# Patient Record
Sex: Female | Born: 2005 | Race: White | Hispanic: No | Marital: Single | Smoking: Never smoker
Health system: Southern US, Community
[De-identification: ages and names within clinical notes are randomized; demographics above are authoritative.]

## PROBLEM LIST (undated history)

## (undated) DIAGNOSIS — Z789 Other specified health status: Secondary | ICD-10-CM

## (undated) HISTORY — PX: WISDOM TOOTH EXTRACTION: SHX21

## (undated) HISTORY — PX: NO PAST SURGERIES: SHX2092

## (undated) HISTORY — DX: Other specified health status: Z78.9

---
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
## 2023-05-04 ENCOUNTER — Ambulatory Visit: Payer: Medicaid Other

## 2023-05-04 VITALS — BP 129/59 | Ht 63.0 in | Wt 121.5 lb

## 2023-05-04 DIAGNOSIS — Z309 Encounter for contraceptive management, unspecified: Secondary | ICD-10-CM | POA: Diagnosis not present

## 2023-05-04 DIAGNOSIS — Z3201 Encounter for pregnancy test, result positive: Secondary | ICD-10-CM | POA: Diagnosis not present

## 2023-05-04 LAB — PREGNANCY, URINE: Preg Test, Ur: POSITIVE — AB

## 2023-05-04 MED ORDER — PRENATAL 27-0.8 MG PO TABS: 1.0000 | ORAL_TABLET | Freq: Every day | ORAL | Status: AC

## 2023-05-04 NOTE — Progress Notes (Signed)
UPT positive. Unsure where she plans prenatal care. Local prenatal provider list given and reviewed. Encouraged to establish prenatal care ASAP. Positive preg packet given and reviewed.   The patient was dispensed prenatal vitamins #100 today per [facility] Dr Lorrin Mais. I provided counseling today regarding the medication. We discussed the medication, the side effects and when to call clinic. Patient given the opportunity to ask questions. Questions answered.    Sent to clerk for presumptive Medicaid/preg women. Jerel Shepherd, RN

## 2023-05-05 ENCOUNTER — Telehealth: Payer: Self-pay | Admitting: Family Medicine

## 2023-05-05 NOTE — Telephone Encounter (Signed)
Please give me a call I would like to schedule an ultra sound for myself

## 2023-05-05 NOTE — Telephone Encounter (Signed)
Dr. Larita Fife sent message to Sally Harrison to call client and schedule new OB appt. Call to client and sister answered phone stating client was not there. Counseled sister returning Sally Harrison's call and explained as not yet established in [facility] as a client, [place] could not be ordered. Counseled sister that if having a problem and concerned needed an [place], to call her PCP, Urgent Care or ED evaluation. Per sister, Sally Harrison is newly pregnanat and just wanted to see her baby. Dr. Larita Fife notified of above. Jossie Ng, RN

## 2023-05-16 DIAGNOSIS — O0993 Supervision of high risk pregnancy, unspecified, third trimester: Secondary | ICD-10-CM | POA: Insufficient documentation

## 2023-06-06 LAB — OB RESULTS CONSOLE GC/CHLAMYDIA
Chlamydia: NEGATIVE
Neisseria Gonorrhea: NEGATIVE

## 2023-06-06 LAB — OB RESULTS CONSOLE VARICELLA ZOSTER ANTIBODY, IGG: Varicella: NON-IMMUNE/NOT IMMUNE

## 2023-06-06 LAB — OB RESULTS CONSOLE RUBELLA ANTIBODY, IGM: Rubella: IMMUNE

## 2023-06-06 LAB — OB RESULTS CONSOLE HIV ANTIBODY (ROUTINE TESTING): HIV: NONREACTIVE

## 2023-06-06 LAB — HEPATITIS C ANTIBODY: HCV Ab: NEGATIVE

## 2023-06-06 LAB — OB RESULTS CONSOLE HEPATITIS B SURFACE ANTIGEN: Hepatitis B Surface Ag: NEGATIVE

## 2023-06-06 LAB — OB RESULTS CONSOLE RPR: RPR: NONREACTIVE

## 2023-06-14 ENCOUNTER — Other Ambulatory Visit: Payer: Self-pay

## 2023-06-14 ENCOUNTER — Ambulatory Visit: Payer: Medicaid Other

## 2023-06-14 DIAGNOSIS — O09899 Supervision of other high risk pregnancies, unspecified trimester: Secondary | ICD-10-CM | POA: Insufficient documentation

## 2023-06-14 DIAGNOSIS — O280 Abnormal hematological finding on antenatal screening of mother: Secondary | ICD-10-CM | POA: Insufficient documentation

## 2023-06-19 ENCOUNTER — Other Ambulatory Visit: Payer: Self-pay | Admitting: Obstetrics

## 2023-06-19 ENCOUNTER — Ambulatory Visit: Payer: Medicaid Other

## 2023-06-19 ENCOUNTER — Other Ambulatory Visit: Payer: Self-pay

## 2023-06-19 ENCOUNTER — Ambulatory Visit: Payer: Medicaid Other | Admitting: *Deleted

## 2023-06-19 ENCOUNTER — Other Ambulatory Visit: Payer: Self-pay | Admitting: *Deleted

## 2023-06-19 ENCOUNTER — Ambulatory Visit: Payer: Medicaid Other | Attending: Obstetrics and Gynecology

## 2023-06-19 VITALS — BP 106/55 | HR 65

## 2023-06-19 DIAGNOSIS — O3512X0 Maternal care for (suspected) chromosomal abnormality in fetus, trisomy 18, not applicable or unspecified: Secondary | ICD-10-CM | POA: Insufficient documentation

## 2023-06-19 DIAGNOSIS — O09899 Supervision of other high risk pregnancies, unspecified trimester: Secondary | ICD-10-CM | POA: Insufficient documentation

## 2023-06-19 DIAGNOSIS — O280 Abnormal hematological finding on antenatal screening of mother: Secondary | ICD-10-CM

## 2023-06-19 DIAGNOSIS — Z363 Encounter for antenatal screening for malformations: Secondary | ICD-10-CM

## 2023-06-19 DIAGNOSIS — O285 Abnormal chromosomal and genetic finding on antenatal screening of mother: Secondary | ICD-10-CM | POA: Diagnosis present

## 2023-06-19 DIAGNOSIS — O289 Unspecified abnormal findings on antenatal screening of mother: Secondary | ICD-10-CM | POA: Insufficient documentation

## 2023-06-19 DIAGNOSIS — Z3A12 12 weeks gestation of pregnancy: Secondary | ICD-10-CM | POA: Diagnosis not present

## 2023-06-19 NOTE — Progress Notes (Signed)
[facility] at [facility] [address], [address] Phone:  [phone]   Fax:  [phone]      In-Person Genetic Counseling Clinic Note:   I spoke with 17 y.o. Sally Harrison today to discuss her recent MaterniT21 NIPS results which returned as high-risk for trisomy 25. She was referred by Chari Manning, CNM. She was accompanied by her reproductive partner [place].   Pregnancy History:    G1P0. EGA: [redacted]w[redacted]d by LMP. EDD: 12/27/2023. This is Naevia's first pregnancy.  Reports she takes PNVs. Denies personal history of diabetes, high blood pressure, thyroid conditions, and seizures. Denies bleeding, infections, and fevers in this pregnancy. Denies using tobacco, alcohol, or street drugs in this pregnancy.   Family History:    A three-generation pedigree was created and scanned into Epic under the Media tab.  Shealee reports that her maternal cousin has autism. It is unknown whether he had any genetic testing. We discussed that we are unable to directly test for autism in a pregnancy. Genetic testing for individuals with a clinical diagnosis of autism yields an explanation in only about 20% of cases, and the remaining 80% of cases are left with unknown etiology. Having an affected family member may increase the chance that Soraya's children will have autism; however, without genetic testing performed on affected family members, it is difficult to assess risk to the pregnancy and other family members. The risk may be up to 50% in the case of an identified genetic cause. We also discussed and offered screening for fragile X syndrome, which is one of the most common causes of inherited intellectual disability and autism in males. Fragile X syndrome affects females as well. Denali declined fragile X syndrome carrier screening.  Orene's reproductive partner Irving Shows reports that his mother died from breast cancer at 31 yo. We discussed that  different types of cancer can have a hereditary component that increases an individual's susceptibility to develop cancer; however, most cancers occur by chance due to a combination of genetics and the environment. It is recommended Miguel inform his PCP about his family history so they can discuss appropriate screening and testing options, including a possible referral to cancer genetic counseling.   Irving Shows also reports that his nephew was born with a "hole in his heart." He is otherwise healthy. Congenital heart defects can be genetic or may be multifactorial. Without known medical reports, recurrence risk is difficult to assess.  Maternal ethnicity reported as White and paternal ethnicity reported as [place]. Denies Ashkenazi Jewish ancestry.  Family history not remarkable for consanguinity, individuals with birth defects, intellectual disability, autism spectrum disorder, multiple spontaneous abortions, still births, or unexplained neonatal death.   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 The lab reports a 11 in 100 (11%) chance that this is a true positive result, also known as positive predictive value (PPV). The [employer] and the [employer] reports a 5% PPV.  Moreover, the result was low-risk for trisomy 29, trisomy 3, and sex chromosome aneuploidies. This reduces but does not eliminate the chance the fetus is affected with those conditions. Please see report for details.  It was reviewed with Kali that there are several possible explanations for the high risk cfDNA result, including true trisomy 34, mosaic trisomy 61, or confined placental mosaicism in which there is  discordance between the chromosomes of the placenta and the fetus.  We discussed that trisomy 59 is a chromosomal disorder that affects  multiple organ systems and is associated with congenital heart defects, growth delays, and high risk of fetal loss and stillbirth. Most pregnancies affected with trisomy 59 are identified by characteristic ultrasound findings, such as omphalocele and clenched fists. While many pregnancies with trisomy 18 end in pregnancy loss, those affected that survive to birth are expected to have profound intellectual disability and severe medical problems. Approximately 90% of individuals with trisomy 74 do not survive past the first year of life. We discussed that trisomy 56 typically occurs as a sporadic event and is not inherited through the family. Chromosome analysis is recommended during pregnancy or following delivery to determine if the fetus has typical trisomy 71, resulting from nondisjunction, or if the trisomy 61 is the result of a chromosomal translocation which may be inherited.   We discussed that if Katherene opted for diagnostic testing and results confirmed a diagnosis of trisomy 56, it could impact pregnancy management in several different ways. Some individuals may choose to terminate a pregnancy or consider adoption if a diagnosis of trisomy 56 were confirmed. For individuals who would not alter their pregnancy management regardless of testing outcomes, a prenatal diagnosis could allow for delivery planning and prenatal consults with various specialists that would be involved in the infant's care as well as time to plan and prepare emotionally, physically, and financially. Myrian was also made aware that she has the option of continuing to monitor the pregnancy with routine ultrasounds and pursue genetic testing postnatally.   We reviewed the technical aspects, benefits, risks, and limitations of prenatal diagnosis through chorionic villus sampling (CVS) including the 1 in 500 risk for miscarriage. Contina declined CVS at this time.   Previous Testing Completed:  Negative carrier screening: Tyriana  previously completed carrier screening through [employer]. She screened to not be a carrier for cystic fibrosis (CF) and spinal muscular atrophy (SMA). Her hemoglobin fractionation cascade returned as normal. Please note that this testing can detect beta-hemoglobinopathies but is not able to detect alpha thalassemia carriers. Please see report for details. A negative result on carrier screening reduces but does not eliminate the chance of being a carrier.    Plan of Care:   Trina declined CVS.  Anatomy ultrasound with our clinic is scheduled for 08/07/2023.   Informed consent was obtained. All questions were answered.   I spent 30 minutes in the care of the patient today, including face-to-face time reviewing and  discussing the genetic test results and available next steps.    Thank you for sharing in the care of Kingsley with [place].  Please do not hesitate to contact us at [phone] if you have any questions.   Sheppard Plumber, MS Genetic Counselor   Genetic counseling student involved in appointment: Yes Delorise Jackson Ladean Raya).

## 2023-06-27 ENCOUNTER — Ambulatory Visit: Payer: Self-pay

## 2023-07-20 ENCOUNTER — Emergency Department
Admission: EM | Admit: 2023-07-20 | Discharge: 2023-07-20 | Disposition: A | Discharge status: Home / Self Care | Payer: Medicaid Other | Attending: Emergency Medicine | Admitting: Emergency Medicine

## 2023-07-20 ENCOUNTER — Emergency Department: Payer: Medicaid Other

## 2023-07-20 ENCOUNTER — Other Ambulatory Visit: Payer: Self-pay

## 2023-07-20 DIAGNOSIS — O26892 Other specified pregnancy related conditions, second trimester: Secondary | ICD-10-CM | POA: Diagnosis present

## 2023-07-20 DIAGNOSIS — Z3A17 17 weeks gestation of pregnancy: Secondary | ICD-10-CM | POA: Insufficient documentation

## 2023-07-20 DIAGNOSIS — D72829 Elevated white blood cell count, unspecified: Secondary | ICD-10-CM | POA: Diagnosis not present

## 2023-07-20 DIAGNOSIS — E871 Hypo-osmolality and hyponatremia: Secondary | ICD-10-CM | POA: Insufficient documentation

## 2023-07-20 LAB — URINALYSIS, ROUTINE W REFLEX MICROSCOPIC
Bilirubin Urine: NEGATIVE
Glucose, UA: NEGATIVE mg/dL
Ketones, ur: NEGATIVE mg/dL
Leukocytes,Ua: NEGATIVE
Nitrite: NEGATIVE
Protein, ur: NEGATIVE mg/dL
RBC / HPF: 0 RBC/hpf (ref 0–5)
Specific Gravity, Urine: 1.012 (ref 1.005–1.030)
pH: 6 (ref 5.0–8.0)

## 2023-07-20 LAB — COMPREHENSIVE METABOLIC PANEL
ALT: 12 U/L (ref 0–44)
AST: 24 U/L (ref 15–41)
Albumin: 3.5 g/dL (ref 3.5–5.0)
Alkaline Phosphatase: 46 U/L (ref 38–126)
Anion gap: 9 (ref 5–15)
BUN: 10 mg/dL (ref 6–20)
CO2: 21 mmol/L — ABNORMAL LOW (ref 22–32)
Calcium: 8.7 mg/dL — ABNORMAL LOW (ref 8.9–10.3)
Chloride: 100 mmol/L (ref 98–111)
Creatinine, Ser: 0.52 mg/dL (ref 0.44–1.00)
GFR, Estimated: >60 mL/min (ref >60)
Glucose, Bld: 89 mg/dL (ref 70–99)
Potassium: 4.3 mmol/L (ref 3.5–5.1)
Sodium: 130 mmol/L — ABNORMAL LOW (ref 135–145)
Total Bilirubin: 0.9 mg/dL (ref <1.2)
Total Protein: 7.2 g/dL (ref 6.5–8.1)

## 2023-07-20 LAB — CBC
HCT: 36.3 % (ref 36.0–46.0)
Hemoglobin: 12.8 g/dL (ref 12.0–15.0)
MCH: 33.7 pg (ref 26.0–34.0)
MCHC: 35.3 g/dL (ref 30.0–36.0)
MCV: 95.5 fL (ref 80.0–100.0)
Platelets: 292 10*3/uL (ref 150–400)
RBC: 3.8 MIL/uL — ABNORMAL LOW (ref 3.87–5.11)
RDW: 12.2 % (ref 11.5–15.5)
WBC: 14.9 10*3/uL — ABNORMAL HIGH (ref 4.0–10.5)
nRBC: 0 % (ref 0.0–0.2)

## 2023-07-20 LAB — LIPASE, BLOOD: Lipase: 29 U/L (ref 11–51)

## 2023-07-20 LAB — HCG, QUANTITATIVE, PREGNANCY: hCG, Beta Chain, Quant, S: 54072 m[IU]/mL — ABNORMAL HIGH (ref <5)

## 2023-07-20 MED ORDER — SODIUM CHLORIDE 0.9 % IV BOLUS
500.0000 mL | Freq: Once | INTRAVENOUS | Status: AC
  Administered 2023-07-20: 500 mL via INTRAVENOUS

## 2023-07-20 NOTE — ED Provider Notes (Signed)
[facility] Provider Note    Event Date/Time   First MD Initiated Contact with Patient 07/20/23 1747     (approximate)   History   Abdominal Pain   HPI  Sally Harrison is a 17 y.o. female resents today with history of abdominal pain like cramps that make her abdomen hard since this morning.  Patient denies vaginal bleeding, dysuria, nausea/vomit, diarrhea.  Patient states having a lot of walking at her work.  Patient reports genetic studies compatible with trisomy 86.  Did not have any CVS      Physical Exam   Triage Vital Signs: ED Triage Vitals  Encounter Vitals Group     BP 07/20/23 1501 112/63     Systolic BP Percentile --      Diastolic BP Percentile --      Pulse Rate 07/20/23 1501 90     Resp 07/20/23 1501 16     Temp 07/20/23 1501 98.3 F (36.8 C)     Temp src --      SpO2 07/20/23 1501 98 %     Weight 07/20/23 1502 130 lb (59 kg)     Height 07/20/23 1502 5\' 3"  (1.6 m)     Head Circumference --      Peak Flow --      Pain Score 07/20/23 1502 5     Pain Loc --      Pain Education --      Exclude from Growth Chart --     Most recent vital signs: Vitals:   07/20/23 1501  BP: 112/63  Pulse: 90  Resp: 16  Temp: 98.3 F (36.8 C)  SpO2: 98%     Constitutional: Alert, no distress Eyes: Conjunctivae are normal.  Head: Atraumatic. Nose: No congestion/rhinnorhea. Mouth/Throat: Mucous membranes are moist.  No tonsillar exudates Neck: Painless ROM.  Cardiovascular:   Good peripheral circulation.  Regular rhythm Respiratory: Normal respiratory effort.  No retractions.  No wheezes no crackles Gastrointestinal: Skin without scars, bowel sounds positive. Soft, ender to palpation in suprapubic area.UterineH10 cm.  CVA tenderness negative. Musculoskeletal:  no deformity Neurologic:  MAE spontaneously. No gross focal neurologic deficits are appreciated.  Skin:  Skin is warm, dry and intact. No rash noted. Psychiatric: Mood and  affect are normal. Speech and behavior are normal.    ED Results / Procedures / Treatments   Labs (all labs ordered are listed, but only abnormal results are displayed) Labs Reviewed  COMPREHENSIVE METABOLIC PANEL - Abnormal; Notable for the following components:      Result Value   Sodium 130 (*)    CO2 21 (*)    Calcium 8.7 (*)    All other components within normal limits  URINALYSIS, ROUTINE W REFLEX MICROSCOPIC - Abnormal; Notable for the following components:   Color, Urine STRAW (*)    APPearance CLEAR (*)    Hgb urine dipstick SMALL (*)    Bacteria, UA RARE (*)    All other components within normal limits  HCG, QUANTITATIVE, PREGNANCY - Abnormal; Notable for the following components:   hCG, Beta Chain, Quant, S 54,072 (*)    All other components within normal limits  CBC - Abnormal; Notable for the following components:   WBC 14.9 (*)    RBC 3.80 (*)    All other components within normal limits  LIPASE, BLOOD     EKG     RADIOLOGY I independently reviewed and interpreted imaging and agree with radiologists findings.  PROCEDURES:  Critical Care performed:   Procedures   MEDICATIONS ORDERED IN ED: Medications - No data to display   IMPRESSION / MDM / ASSESSMENT AND PLAN / ED COURSE  I reviewed the triage vital signs and the nursing notes.  Differential diagnosis includes, but is not limited to, Differential diagnosis includes, but is not limited to, threatened miscarriage, incomplete miscarriage, normal bleeding from an early trimester pregnancy, ectopic pregnancy, , blighted ovum, vaginal/cervical trauma, subchorionic hemorrhage/hematoma, etc.    Patient's presentation is most consistent with acute complicated illness / injury requiring diagnostic workup.   Patient's diagnosis is consistent with contractions secondary to dehydration. I independently reviewed and interpreted imaging and agree with radiologists findings. Labs are rea reassuring. I  did review the patient's allergies and medications.  Patient received sodium chloride 0.9% 500 mL bolus.Patient will be discharged home without prescriptions.  Patient will be discharged home with work note recommending to decrease physical activity at work.Patient is to follow up with OB/GYN as needed or otherwise directed. Patient is given ED precautions to return to the ED for any worsening or new symptoms. Discussed plan of care with patient, answered all of patient's questions, Patient agreeable to plan of care. Advised patient to take medications according to the instructions on the label. Discussed possible side effects of new medications. Patient verbalized understanding. Clinical Course as of 07/20/23 1928  Thu Jul 20, 2023  1826 Urinalysis, Routine w reflex microscopic -Urine, Clean Catch(!) Hemoglobin positive, no UTI [AE]  1826 CBC(!) White blood cells elevated [AE]  1826 hCG, quantitative, pregnancy(!) Positive [AE]  1826 Hyponatremia [AE]  1826 Lipase, blood Within normal limits [AE]    Clinical Course User Index [AE] Gladys Damme, PA-C     FINAL CLINICAL IMPRESSION(S) / ED DIAGNOSES   Final diagnoses:  None     Rx / DC Orders   ED Discharge Orders     None     Transfer care to carry NP at 7:29 PM   Note:  This document was prepared using Dragon voice recognition software and may include unintentional dictation errors.   Gladys Damme, PA-C 07/20/23 1932    Dionne Bucy, MD 07/20/23 [phone]

## 2023-07-20 NOTE — ED Triage Notes (Signed)
Pt to ED for abd pain started this am. [redacted] weeks pregnant. Denies n/v/d. Reports some back pain. Denies urinary sx.

## 2023-07-20 NOTE — ED Provider Notes (Signed)
-----------------------------------------   7:15 PM on 07/20/2023 -----------------------------------------  Blood pressure (!) 123/58, pulse 93, temperature 98.3 F (36.8 C), temperature source Oral, resp. rate 20, height 5\' 3"  (1.6 m), weight 59 kg, last menstrual period 03/22/2023, SpO2 100%.  Assuming care from Cyndia Diver, PA-C.  In short, Sally Harrison is a 17 y.o. female with a chief complaint of Abdominal Pain .  Refer to the original H&P for additional details.  The current plan of care is to finish IV fluids and discharge home with work note.  Marland Kitchen ----------------------------------------- 8:16 PM on 07/20/2023 ----------------------------------------- IV fluids are finished.  Patient states that she feels better.  She will be discharged home with a work excuse for tomorrow and then light duty upon return until cleared by OB/GYN.  She was encouraged to return to the emergency department for symptoms of change or worsen or for new concerns if she is unable to see the specialist.   Chinita Pester, FNP 07/20/23 2017    Dionne Bucy, MD 07/20/23 [phone]

## 2023-08-07 ENCOUNTER — Ambulatory Visit: Payer: Medicaid Other | Attending: Obstetrics | Admitting: Obstetrics

## 2023-08-07 ENCOUNTER — Ambulatory Visit: Payer: Medicaid Other | Admitting: *Deleted

## 2023-08-07 ENCOUNTER — Other Ambulatory Visit: Payer: Self-pay | Admitting: *Deleted

## 2023-08-07 ENCOUNTER — Ambulatory Visit: Payer: Medicaid Other | Attending: Obstetrics and Gynecology

## 2023-08-07 VITALS — BP 124/63 | HR 79

## 2023-08-07 DIAGNOSIS — Z3A19 19 weeks gestation of pregnancy: Secondary | ICD-10-CM | POA: Diagnosis not present

## 2023-08-07 DIAGNOSIS — O280 Abnormal hematological finding on antenatal screening of mother: Secondary | ICD-10-CM

## 2023-08-07 DIAGNOSIS — O09892 Supervision of other high risk pregnancies, second trimester: Secondary | ICD-10-CM

## 2023-08-07 DIAGNOSIS — O3512X0 Maternal care for (suspected) chromosomal abnormality in fetus, trisomy 18, not applicable or unspecified: Secondary | ICD-10-CM | POA: Diagnosis not present

## 2023-08-07 DIAGNOSIS — O285 Abnormal chromosomal and genetic finding on antenatal screening of mother: Secondary | ICD-10-CM

## 2023-08-07 NOTE — Progress Notes (Signed)
 MFM Note  Sally Harrison is currently at 19 weeks and 5 days.  She was seen for a detailed fetal anatomy scan as her cell free DNA test was positive for trisomy 18.  The positive predictive value for trisomy 18 as reported by [facility] (Materni T21) is only 11.1%.  She denies any other problems in her current pregnancy.    The risk for trisomy 68 and trisomy 13 based on the cell free DNA test was negative.  A female fetus is predicted.  She was informed that the fetal growth and amniotic fluid level were appropriate for her gestational age.   On today's exam, an intracardiac echogenic focus was noted in the left ventricle of the fetal heart.    The small association between an echogenic focus and Down syndrome was discussed.   There were no sonographic markers associated with trisomy 18 noted on today's exam.  The patient was advised that it is unlikely that her baby has trisomy 54.    Due to the echogenic focus noted today along with her cell free DNA test indicating a high risk for trisomy 51, the patient was offered and declined an amniocentesis today for definitive diagnosis of fetal aneuploidy.    As the abnormal trisomy 18 cells may often be confined only to the placenta (confined placental mosaicism), due to the increased risk of IUGR associated with confined placental mosaicism, we will continue to follow her with growth scans throughout her pregnancy.  The patient was informed that anomalies may be missed due to technical limitations. If the fetus is in a suboptimal position or maternal habitus is increased, visualization of the fetus in the maternal uterus may be impaired.  A follow-up exam was scheduled in 5 weeks.    The patient stated that all of her questions were answered today.  A total of 30 minutes was spent counseling and coordinating the care for this patient.  Greater than 50% of the time was spent in direct face-to-face contact.

## 2023-09-11 ENCOUNTER — Other Ambulatory Visit: Payer: Self-pay

## 2023-09-11 ENCOUNTER — Ambulatory Visit: Payer: Medicaid Other | Attending: Obstetrics and Gynecology

## 2023-09-11 DIAGNOSIS — O285 Abnormal chromosomal and genetic finding on antenatal screening of mother: Secondary | ICD-10-CM

## 2023-09-11 DIAGNOSIS — Z3A24 24 weeks gestation of pregnancy: Secondary | ICD-10-CM | POA: Diagnosis not present

## 2023-09-11 DIAGNOSIS — O280 Abnormal hematological finding on antenatal screening of mother: Secondary | ICD-10-CM | POA: Diagnosis present

## 2023-10-04 LAB — OB RESULTS CONSOLE RPR: RPR: NONREACTIVE

## 2023-10-04 LAB — OB RESULTS CONSOLE HIV ANTIBODY (ROUTINE TESTING): HIV: NONREACTIVE

## 2023-11-29 LAB — OB RESULTS CONSOLE GC/CHLAMYDIA
Chlamydia: NEGATIVE
Neisseria Gonorrhea: NEGATIVE

## 2023-11-29 LAB — OB RESULTS CONSOLE GBS: GBS: POSITIVE

## 2023-12-19 ENCOUNTER — Other Ambulatory Visit: Payer: Self-pay | Admitting: Certified Nurse Midwife

## 2023-12-19 DIAGNOSIS — Z3490 Encounter for supervision of normal pregnancy, unspecified, unspecified trimester: Secondary | ICD-10-CM

## 2023-12-20 ENCOUNTER — Encounter: Payer: Self-pay | Admitting: Obstetrics and Gynecology

## 2023-12-20 ENCOUNTER — Inpatient Hospital Stay
Admission: EM | Admit: 2023-12-20 | Discharge: 2023-12-23 | DRG: 787 | Disposition: A | Discharge status: Home / Self Care | Attending: Obstetrics and Gynecology | Admitting: Obstetrics and Gynecology

## 2023-12-20 ENCOUNTER — Inpatient Hospital Stay: Admitting: Anesthesiology

## 2023-12-20 ENCOUNTER — Other Ambulatory Visit: Payer: Self-pay

## 2023-12-20 DIAGNOSIS — O9081 Anemia of the puerperium: Secondary | ICD-10-CM | POA: Diagnosis not present

## 2023-12-20 DIAGNOSIS — D62 Acute posthemorrhagic anemia: Secondary | ICD-10-CM | POA: Diagnosis not present

## 2023-12-20 DIAGNOSIS — O99824 Streptococcus B carrier state complicating childbirth: Secondary | ICD-10-CM | POA: Diagnosis present

## 2023-12-20 DIAGNOSIS — Z3490 Encounter for supervision of normal pregnancy, unspecified, unspecified trimester: Principal | ICD-10-CM | POA: Diagnosis present

## 2023-12-20 DIAGNOSIS — Z23 Encounter for immunization: Secondary | ICD-10-CM

## 2023-12-20 DIAGNOSIS — Z98891 History of uterine scar from previous surgery: Principal | ICD-10-CM

## 2023-12-20 DIAGNOSIS — Z3A39 39 weeks gestation of pregnancy: Secondary | ICD-10-CM | POA: Diagnosis not present

## 2023-12-20 DIAGNOSIS — O26893 Other specified pregnancy related conditions, third trimester: Secondary | ICD-10-CM | POA: Diagnosis present

## 2023-12-20 LAB — CBC
HCT: 36.6 % (ref 36.0–46.0)
Hemoglobin: 12.4 g/dL (ref 12.0–15.0)
MCH: 29.6 pg (ref 26.0–34.0)
MCHC: 33.9 g/dL (ref 30.0–36.0)
MCV: 87.4 fL (ref 80.0–100.0)
Platelets: 271 10*3/uL (ref 150–400)
RBC: 4.19 MIL/uL (ref 3.87–5.11)
RDW: 13.2 % (ref 11.5–15.5)
WBC: 13 10*3/uL — ABNORMAL HIGH (ref 4.0–10.5)
nRBC: 0 % (ref 0.0–0.2)

## 2023-12-20 LAB — RPR: RPR Ser Ql: NONREACTIVE

## 2023-12-20 LAB — TYPE AND SCREEN
ABO/RH(D): O POS
Antibody Screen: NEGATIVE

## 2023-12-20 LAB — ABO/RH: ABO/RH(D): O POS

## 2023-12-20 MED ORDER — BUPIVACAINE HCL (PF) 0.5 % IJ SOLN
INTRAMUSCULAR | Status: AC
  Filled 2023-12-20: qty 10

## 2023-12-20 MED ORDER — FENTANYL-BUPIVACAINE-NACL 0.5-0.125-0.9 MG/250ML-% EP SOLN
12.0000 mL/h | EPIDURAL | Status: DC | PRN
  Administered 2023-12-20: 12 mL/h via EPIDURAL

## 2023-12-20 MED ORDER — LIDOCAINE-EPINEPHRINE (PF) 1.5 %-1:200000 IJ SOLN
INTRAMUSCULAR | Status: DC | PRN
Start: 2023-12-20 — End: 2023-12-21
  Administered 2023-12-20 (×2): 3 mL via PERINEURAL

## 2023-12-20 MED ORDER — LIDOCAINE HCL (PF) 1 % IJ SOLN
INTRAMUSCULAR | Status: DC | PRN
  Administered 2023-12-20: 3 mL via SUBCUTANEOUS

## 2023-12-20 MED ORDER — OXYTOCIN 10 UNIT/ML IJ SOLN
INTRAMUSCULAR | Status: AC
  Filled 2023-12-20: qty 2

## 2023-12-20 MED ORDER — OXYTOCIN-SODIUM CHLORIDE 30-0.9 UT/500ML-% IV SOLN
2.5000 [IU]/h | INTRAVENOUS | Status: DC
  Administered 2023-12-21: 30 [IU] via INTRAVENOUS
  Administered 2023-12-21: 2.5 [IU]/h via INTRAVENOUS
  Filled 2023-12-20 (×2): qty 500

## 2023-12-20 MED ORDER — ONDANSETRON HCL 4 MG/2ML IJ SOLN: 4.0000 mg | Freq: Four times a day (QID) | INTRAMUSCULAR | Status: DC | PRN

## 2023-12-20 MED ORDER — PHENYLEPHRINE 80 MCG/ML (10ML) SYRINGE FOR IV PUSH (FOR BLOOD PRESSURE SUPPORT)
80.0000 ug | PREFILLED_SYRINGE | INTRAVENOUS | Status: AC | PRN
  Administered 2023-12-21 (×2): 100 ug via INTRAVENOUS
  Filled 2023-12-20: qty 10

## 2023-12-20 MED ORDER — MISOPROSTOL 200 MCG PO TABS
ORAL_TABLET | ORAL | Status: AC
  Filled 2023-12-20: qty 4

## 2023-12-20 MED ORDER — LIDOCAINE HCL (PF) 1 % IJ SOLN
INTRAMUSCULAR | Status: AC
  Filled 2023-12-20: qty 30

## 2023-12-20 MED ORDER — OXYTOCIN-SODIUM CHLORIDE 30-0.9 UT/500ML-% IV SOLN
1.0000 m[IU]/min | INTRAVENOUS | Status: DC
  Administered 2023-12-20: 2 m[IU]/min via INTRAVENOUS
  Filled 2023-12-20: qty 500

## 2023-12-20 MED ORDER — TERBUTALINE SULFATE 1 MG/ML IJ SOLN
0.2500 mg | Freq: Once | INTRAMUSCULAR | Status: AC | PRN
  Administered 2023-12-20: 0.25 mg via SUBCUTANEOUS
  Filled 2023-12-20: qty 1

## 2023-12-20 MED ORDER — OXYTOCIN BOLUS FROM INFUSION: 333.0000 mL | Freq: Once | INTRAVENOUS | Status: DC

## 2023-12-20 MED ORDER — ACETAMINOPHEN 325 MG PO TABS: 650.0000 mg | ORAL_TABLET | ORAL | Status: DC | PRN

## 2023-12-20 MED ORDER — DIPHENHYDRAMINE HCL 50 MG/ML IJ SOLN
50.0000 mg | Freq: Once | INTRAMUSCULAR | Status: DC
  Filled 2023-12-20: qty 1

## 2023-12-20 MED ORDER — AMMONIA AROMATIC IN INHA
RESPIRATORY_TRACT | Status: AC
  Filled 2023-12-20: qty 10

## 2023-12-20 MED ORDER — SOD CITRATE-CITRIC ACID 500-334 MG/5ML PO SOLN: 30.0000 mL | ORAL | Status: DC | PRN

## 2023-12-20 MED ORDER — LIDOCAINE HCL (PF) 1 % IJ SOLN: 30.0000 mL | INTRAMUSCULAR | Status: DC | PRN

## 2023-12-20 MED ORDER — LACTATED RINGERS IV SOLN
500.0000 mL | INTRAVENOUS | Status: DC | PRN
  Administered 2023-12-21: 500 mL via INTRAVENOUS

## 2023-12-20 MED ORDER — EPHEDRINE 5 MG/ML INJ
10.0000 mg | INTRAVENOUS | Status: DC | PRN
  Administered 2023-12-20: 10 mg via INTRAVENOUS

## 2023-12-20 MED ORDER — MISOPROSTOL 25 MCG QUARTER TABLET
25.0000 ug | ORAL_TABLET | ORAL | Status: DC
  Administered 2023-12-20 (×3): 25 ug via ORAL
  Filled 2023-12-20 (×3): qty 1

## 2023-12-20 MED ORDER — LACTATED RINGERS IV SOLN: INTRAVENOUS | Status: DC

## 2023-12-20 MED ORDER — PHENYLEPHRINE 80 MCG/ML (10ML) SYRINGE FOR IV PUSH (FOR BLOOD PRESSURE SUPPORT): 80.0000 ug | PREFILLED_SYRINGE | INTRAVENOUS | Status: DC | PRN

## 2023-12-20 MED ORDER — SODIUM CHLORIDE 0.9 % IV SOLN
5.0000 10*6.[IU] | Freq: Once | INTRAVENOUS | Status: AC
  Administered 2023-12-20: 5 10*6.[IU] via INTRAVENOUS
  Filled 2023-12-20 (×2): qty 5

## 2023-12-20 MED ORDER — PENICILLIN G POT IN DEXTROSE 60000 UNIT/ML IV SOLN
3.0000 10*6.[IU] | INTRAVENOUS | Status: DC
  Administered 2023-12-20 – 2023-12-21 (×3): 3 10*6.[IU] via INTRAVENOUS
  Filled 2023-12-20 (×3): qty 50

## 2023-12-20 MED ORDER — LACTATED RINGERS IV SOLN
500.0000 mL | Freq: Once | INTRAVENOUS | Status: AC
  Administered 2023-12-20: 500 mL via INTRAVENOUS

## 2023-12-20 MED ORDER — EPHEDRINE 5 MG/ML INJ
10.0000 mg | INTRAVENOUS | Status: DC | PRN
  Administered 2023-12-20: 10 mg via INTRAVENOUS
  Filled 2023-12-20: qty 5

## 2023-12-20 MED ORDER — FENTANYL CITRATE (PF) 100 MCG/2ML IJ SOLN
INTRAMUSCULAR | Status: AC
  Filled 2023-12-20: qty 2

## 2023-12-20 MED ORDER — FENTANYL-BUPIVACAINE-NACL 0.5-0.125-0.9 MG/250ML-% EP SOLN
EPIDURAL | Status: AC
Start: 2023-12-20 — End: 2023-12-20
  Filled 2023-12-20: qty 250

## 2023-12-20 MED ORDER — DIPHENHYDRAMINE HCL 50 MG/ML IJ SOLN: 12.5000 mg | INTRAMUSCULAR | Status: DC | PRN

## 2023-12-20 MED ORDER — FENTANYL CITRATE (PF) 100 MCG/2ML IJ SOLN
INTRAMUSCULAR | Status: DC | PRN
  Administered 2023-12-20 – 2023-12-21 (×2): 100 ug via EPIDURAL

## 2023-12-20 MED ORDER — FENTANYL CITRATE (PF) 100 MCG/2ML IJ SOLN
50.0000 ug | INTRAMUSCULAR | Status: DC | PRN
  Administered 2023-12-20 (×2): 100 ug via INTRAVENOUS
  Filled 2023-12-20 (×2): qty 2

## 2023-12-20 MED ORDER — MISOPROSTOL 25 MCG QUARTER TABLET
25.0000 ug | ORAL_TABLET | ORAL | Status: DC
  Administered 2023-12-20 (×3): 25 ug via VAGINAL
  Filled 2023-12-20 (×3): qty 1

## 2023-12-20 MED ORDER — BUPIVACAINE HCL (PF) 0.25 % IJ SOLN
INTRAMUSCULAR | Status: DC | PRN
Start: 2023-12-20 — End: 2023-12-21
  Administered 2023-12-20: 3 mL via EPIDURAL
  Administered 2023-12-20 (×2): 2 mL via EPIDURAL
  Administered 2023-12-20: 3 mL via EPIDURAL

## 2023-12-20 NOTE — Anesthesia Preprocedure Evaluation (Signed)
 Anesthesia Evaluation  Patient identified by MRN, date of birth, ID band Patient awake    Reviewed: Allergy & Precautions, NPO status , Patient's Chart, lab work & pertinent test results, reviewed documented beta blocker date and time   History of Anesthesia Complications Negative for: history of anesthetic complications  Airway Mallampati: II  TM Distance: <3 FB Neck ROM: full  Mouth opening: Limited Mouth Opening  Dental  (+) Dental Advidsory Given   Pulmonary neg pulmonary ROS   Pulmonary exam normal        Cardiovascular Normal cardiovascular exam     Neuro/Psych negative neurological ROS  negative psych ROS   GI/Hepatic negative GI ROS, Neg liver ROS,,,  Endo/Other  negative endocrine ROS    Renal/GU negative Renal ROS  negative genitourinary   Musculoskeletal   Abdominal   Peds  Hematology negative hematology ROS (+)   Anesthesia Other Findings   Reproductive/Obstetrics                             Anesthesia Physical Anesthesia Plan  ASA: 2  Anesthesia Plan: Epidural   Post-op Pain Management:    Induction:   PONV Risk Score and Plan:   Airway Management Planned:   Additional Equipment:   Intra-op Plan:   Post-operative Plan:   Informed Consent: I have reviewed the patients History and Physical, chart, labs and discussed the procedure including the risks, benefits and alternatives for the proposed anesthesia with the patient or authorized representative who has indicated his/her understanding and acceptance.     Dental Advisory Given  Plan Discussed with: Anesthesiologist  Anesthesia Plan Comments:        Anesthesia Quick Evaluation

## 2023-12-20 NOTE — Progress Notes (Signed)
 Labor Progress Note  Sally Harrison is a 18 y.o. G1P0000 at [redacted]w[redacted]d by LMP admitted for induction of labor due to Elective at term.  Subjective: now comfortable with epidural replacement  Objective: BP (!) 133/58   Pulse 87   Temp 98.2 F (36.8 C) (Oral)   Resp 16   Ht 5\' 3"  (1.6 m)   Wt 77 kg   LMP 03/22/2023   SpO2 98%   BMI 30.07 kg/m    Fetal Assessment: FHT:  FHR: 150 bpm, variability: moderate,  accelerations:  Present,  decelerations:  Present prolonged deceleration x 5 mins Category/reactivity:  Category II UC:   regular, every 1-2 minutes Labs: Lab Results  Component Value Date   WBC 13.0 (H) 12/20/2023   HGB 12.4 12/20/2023   HCT 36.6 12/20/2023   MCV 87.4 12/20/2023   PLT 271 12/20/2023      Current Vital Signs 24h Vital Sign Ranges  T 98.2 F (36.8 C) Temp  Avg: 98.3 F (36.8 C)  Min: 98 F (36.7 C)  Max: 98.6 F (37 C)  BP (!) 133/58 BP  Min: 87/67  Max: 145/90  HR 87 Pulse  Avg: 83.8  Min: 65  Max: 91  RR 16 Resp  Avg: 16.5  Min: 16  Max: 18  SaO2 98 %   SpO2  Avg: 98.8 %  Min: 96 %  Max: 100 %        Assessment / Plan: A/P: 18 y.o. G1P0000 female at [redacted]w[redacted]d with normal labor  1.  Labor: Early latent labor. preeclampsia labs stable and pain controlled  Epidural 2.  [id number] assessment: Category II 3.  Group B Strep positive 4. Membranes ruptured, clear fluid 5.  Pain: none 6.  Recheck:Evaluated by digital exam. 7. Intervention: IV fluid bolus, change maternal position, reduce stimulation (IV Pitocin ), D/C stimulation, administer tocolytic, and anticipate vaginal delivery Prolonged deceleration noted after epidural replacement, BP noted to be hypotensive, patient given ephedrine . Terbutaline  given  and patient placed on right side.  Dr Alvia Awkward on unit and aware of labor events.   Sally Harrison Laurence [facility] 12/20/2023 11:04 PM

## 2023-12-20 NOTE — Anesthesia Procedure Notes (Signed)
 Epidural Patient location during procedure: OB  Staffing Anesthesiologist: Lattie Poli, MD Performed: anesthesiologist   Preanesthetic Checklist Completed: patient identified, IV checked, site marked, risks and benefits discussed, surgical consent, monitors and equipment checked, pre-op evaluation and timeout performed  Epidural Patient position: sitting Prep: ChloraPrep Patient monitoring: heart rate, continuous pulse ox and blood pressure Approach: midline Location: L2-L3 Injection technique: LOR saline  Needle:  Needle type: Tuohy  Needle gauge: 17 G Needle length: 9 cm Needle insertion depth: 6 cm Catheter type: closed end flexible Catheter size: 19 Gauge Catheter at skin depth: 11 cm Test dose: negative and 1.5% lidocaine  with Epi 1:200 K  Assessment Sensory level: T10 Events: blood not aspirated, no cerebrospinal fluid, injection not painful, no injection resistance, no paresthesia and negative IV test  Additional Notes Second epidural One attempt/insertion site. Pt. Evaluated and documentation done after procedure finished. Patient identified. Risks/Benefits/Options discussed with patient including but not limited to bleeding, infection, nerve damage, paralysis, failed block, incomplete pain control, headache, blood pressure changes, nausea, vomiting, reactions to medication both or allergic, itching and postpartum back pain. Confirmed with bedside nurse the patient's most recent platelet count. Confirmed with patient that they are not currently taking any anticoagulation, have any bleeding history or any family history of bleeding disorders. Patient expressed understanding and wished to proceed. All questions were answered. Sterile technique was used throughout the entire procedure. Please see nursing notes for vital signs. Test dose was given through epidural catheter and negative prior to continuing to dose epidural or start infusion. Warning signs of high block given to  the patient including shortness of breath, tingling/numbness in hands, complete motor block, or any concerning symptoms with instructions to call for help. Patient was given instructions on fall risk and not to get out of bed. All questions and concerns addressed with instructions to call with any issues or inadequate analgesia.     Patient tolerated the insertion well without immediate complications.  Reason for block: procedure for painReason for block:procedure for pain

## 2023-12-20 NOTE — Progress Notes (Signed)
 Labor Progress Note  Sally Harrison is a 18 y.o. G1P0000 at [redacted]w[redacted]d by LMP admitted for induction of labor due to Elective at term.  Subjective: crying out in pain with contractions and requesting an epidural  Objective: BP 129/62 (BP Location: Left Arm)   Pulse 90   Temp 98 F (36.7 C) (Oral)   Resp 18   Ht 5\' 3"  (1.6 m)   Wt 77 kg   LMP 03/22/2023   BMI 30.07 kg/m    Fetal Assessment: FHT:  FHR: 130 bpm, variability: moderate,  accelerations:  Present,  decelerations:  Absent Category/reactivity:  Category I UC:   regular, every 2-3 minutes Labs: Lab Results  Component Value Date   WBC 13.0 (H) 12/20/2023   HGB 12.4 12/20/2023   HCT 36.6 12/20/2023   MCV 87.4 12/20/2023   PLT 271 12/20/2023      Current Vital Signs 24h Vital Sign Ranges  T 98 F (36.7 C) Temp  Avg: 98.2 F (36.8 C)  Min: 98 F (36.7 C)  Max: 98.5 F (36.9 C)  BP 129/62 BP  Min: 121/66  Max: 131/87  HR 90 Pulse  Avg: 76.7  Min: 65  Max: 90  RR 18 Resp  Avg: 17  Min: 16  Max: 18  SaO2     No data recorded        Assessment / Plan: A/P: 18 y.o. G1P0000 female at [redacted]w[redacted]d with an elective IOL at term  1.  Labor: Early latent labor. pain controlled  Epidural 2.  [id number] assessment: Category I 3.  Group B Strep positive 4. Membranes ruptured, clear fluid 5.  Pain: not receiving treatment and level of pain (1-10, 10 severe), 10/10 6.  Recheck:Evaluated by digital exam. and unchanged 7. Continue present management.  [name] [facility] 12/20/2023 5:29 PM

## 2023-12-20 NOTE — Progress Notes (Signed)
 L&D Note    Subjective:  Reporting continued pain with contractions post epidural  Objective:      12/20/2023    7:16 PM 12/20/2023    7:15 PM 12/20/2023    7:12 PM  Vitals with BMI  Systolic 113 87 124  Diastolic 69 67 69  Pulse 91 91 82     Gen: alert, cooperative, no distress FHR: Baseline: 130 bpm, Variability: moderate, Accels: Present, Decels: none Toco: regular, every 1-3 minutes SVE: Dilation: 2.5 Effacement (%): 50 Cervical Position: Middle Station: -1 Presentation: Vertex Exam by::  Calton Catholic  CNM)  Medications SCHEDULED MEDICATIONS   ammonia       lidocaine  (PF)       misoprostol       misoprostol  25 mcg Oral Q4H   And   misoprostol  25 mcg Vaginal Q4H   oxytocin       oxytocin 40 units in LR 1000 mL  333 mL Intravenous Once    MEDICATION INFUSIONS   fentaNYL  2 mcg/mL w/bupivacaine 0.125% in NS 250 mL 12 mL/hr (12/20/23 1759)   lactated ringers     lactated ringers 125 mL/hr at 12/20/23 0855   oxytocin     oxytocin 4 milli-units/min (12/20/23 1801)   pencillin G potassium IV 3 Million Units (12/20/23 1655)    PRN MEDICATIONS  acetaminophen , ammonia, diphenhydrAMINE, ePHEDrine, ePHEDrine, fentaNYL  (SUBLIMAZE ) injection, fentaNYL  2 mcg/mL w/bupivacaine 0.125% in NS 250 mL, lactated ringers, lidocaine  (PF), lidocaine  (PF), misoprostol, ondansetron , oxytocin, phenylephrine, phenylephrine, sodium citrate-citric acid, terbutaline   Assessment & Plan:  18 y.o. G1P0000 at [redacted]w[redacted]d admitted for Labor_induction_indication: Elective at term -GBS: positive -IP Antibiotics: abx: PCN x2 doses -Membranes ruptured, clear fluid -Recheck:Evaluated by digital exam. -Preeclampsia:  labs stable -Pain: present - not adequately treated and interventions in place, anesthesia notified per RN -Continue present management. -Analgesia: regional anesthesia   Sally Harrison, CNM  12/20/2023 7:18 PM  [facility]

## 2023-12-20 NOTE — Anesthesia Procedure Notes (Signed)
 Epidural Patient location during procedure: OB Start time: 12/20/2023 5:39 PM End time: 12/20/2023 6:05 PM  Staffing Anesthesiologist: Lattie Poli, MD Resident/CRNA: Angelia Kelp, CRNA Performed: resident/CRNA   Preanesthetic Checklist Completed: patient identified, IV checked, site marked, risks and benefits discussed, surgical consent, monitors and equipment checked, pre-op evaluation and timeout performed  Epidural Patient position: sitting Prep: ChloraPrep Patient monitoring: heart rate, continuous pulse ox and blood pressure Approach: midline Location: L3-L4 Injection technique: LOR saline  Needle:  Needle type: Tuohy  Needle gauge: 17 G Needle length: 9 cm and 9 Needle insertion depth: 9 cm Catheter type: closed end flexible Catheter size: 19 Gauge Catheter at skin depth: 12 and 12 cm Test dose: negative and 1.5% lidocaine  with Epi 1:200 K  Assessment Sensory level: T10 Events: blood not aspirated, no cerebrospinal fluid, injection not painful, no injection resistance, no paresthesia and negative IV test  Additional Notes 1 attempt Pt. Evaluated and documentation done after procedure finished. Patient identified. Risks/Benefits/Options discussed with patient including but not limited to bleeding, infection, nerve damage, paralysis, failed block, incomplete pain control, headache, blood pressure changes, nausea, vomiting, reactions to medication both or allergic, itching and postpartum back pain. Confirmed with bedside nurse the patient's most recent platelet count. Confirmed with patient that they are not currently taking any anticoagulation, have any bleeding history or any family history of bleeding disorders. Patient expressed understanding and wished to proceed. All questions were answered. Sterile technique was used throughout the entire procedure. Please see nursing notes for vital signs. Test dose was given through epidural catheter and negative prior to  continuing to dose epidural or start infusion. Warning signs of high block given to the patient including shortness of breath, tingling/numbness in hands, complete motor block, or any concerning symptoms with instructions to call for help. Patient was given instructions on fall risk and not to get out of bed. All questions and concerns addressed with instructions to call with any issues or inadequate analgesia.    Patient tolerated the insertion well without immediate complications.Reason for block:procedure for pain
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
## 2023-12-20 NOTE — Progress Notes (Signed)
 L&D Note    Subjective:  Feeling pain with contractions  Objective:      12/20/2023   11:20 AM 12/20/2023    7:15 AM 12/20/2023    1:16 AM  Vitals with BMI  Height   5\' 3"   Weight   169 lbs 12 oz  BMI   30.08  Systolic 129 131   Diastolic 62 87   Pulse 90 65      Gen: alert, cooperative, no distress FHR: Baseline: 130 bpm, Variability: moderate, Accels: Present, Decels: none Toco: regular, every 2-3 minutes SVE: Dilation: 2 Effacement (%): 50 Station: -1 Exam by:: Marcille Severance CNM  Medications SCHEDULED MEDICATIONS   ammonia        lidocaine  (PF)       misoprostol        misoprostol   25 mcg Oral Q4H   And   misoprostol   25 mcg Vaginal Q4H   oxytocin        oxytocin  40 units in LR 1000 mL  333 mL Intravenous Once    MEDICATION INFUSIONS   lactated ringers      lactated ringers  125 mL/hr at 12/20/23 0855   oxytocin      oxytocin  6 milli-units/min (12/20/23 1545)   pencillin G potassium IV 3 Million Units (12/20/23 1655)    PRN MEDICATIONS  acetaminophen , ammonia , fentaNYL  (SUBLIMAZE ) injection, lactated ringers , lidocaine  (PF), lidocaine  (PF), misoprostol , ondansetron , oxytocin , sodium citrate -citric acid , terbutaline    Assessment & Plan:  18 y.o. G1P0000 at [redacted]w[redacted]d admitted for Labor_induction_indication: Elective at term -GBS: positive -IP Antibiotics: abx: PCN x 1 dose -Membranes ruptured, clear fluid -Recheck:Evaluated by digital exam. -Preeclampsia:  labs stable -Pain: level of pain (1-10, 10 severe), 8/10 -Intervention: IV Pitocin  augmentation, increase Pitocin  rate, change maternal position, and AROM -Pe_uterus_labor: Adequate relaxation between contractions. -Analgesia: IVPM   Mckay Tegtmeyer, CNM  12/20/2023 [facility]

## 2023-12-21 ENCOUNTER — Encounter
Admission: EM | Disposition: A | Discharge status: Home / Self Care | Payer: Self-pay | Source: Home / Self Care | Attending: Obstetrics

## 2023-12-21 ENCOUNTER — Encounter: Payer: Self-pay | Admitting: Obstetrics and Gynecology

## 2023-12-21 ENCOUNTER — Other Ambulatory Visit: Payer: Self-pay

## 2023-12-21 LAB — CBC
HCT: 32.1 % — ABNORMAL LOW (ref 36.0–46.0)
Hemoglobin: 11.3 g/dL — ABNORMAL LOW (ref 12.0–15.0)
MCH: 30.5 pg (ref 26.0–34.0)
MCHC: 35.2 g/dL (ref 30.0–36.0)
MCV: 86.5 fL (ref 80.0–100.0)
Platelets: 242 10*3/uL (ref 150–400)
RBC: 3.71 MIL/uL — ABNORMAL LOW (ref 3.87–5.11)
RDW: 13.5 % (ref 11.5–15.5)
WBC: 25.7 10*3/uL — ABNORMAL HIGH (ref 4.0–10.5)
nRBC: 0 % (ref 0.0–0.2)

## 2023-12-21 LAB — CREATININE, SERUM
Creatinine, Ser: 0.56 mg/dL (ref 0.44–1.00)
GFR, Estimated: >60 mL/min (ref >60)

## 2023-12-21 SURGERY — Surgical Case
Anesthesia: Epidural

## 2023-12-21 SURGERY — Surgical Case

## 2023-12-21 MED ORDER — ONDANSETRON HCL 4 MG/2ML IJ SOLN
INTRAMUSCULAR | Status: DC | PRN
  Administered 2023-12-21: 4 mg via INTRAVENOUS

## 2023-12-21 MED ORDER — TRANEXAMIC ACID-NACL 1000-0.7 MG/100ML-% IV SOLN
INTRAVENOUS | Status: AC
Start: 2023-12-21 — End: 2023-12-21
  Filled 2023-12-21: qty 100

## 2023-12-21 MED ORDER — FERROUS SULFATE 325 (65 FE) MG PO TABS
325.0000 mg | ORAL_TABLET | Freq: Two times a day (BID) | ORAL | Status: DC
  Administered 2023-12-21 – 2023-12-23 (×4): 325 mg via ORAL
  Filled 2023-12-21 (×4): qty 1

## 2023-12-21 MED ORDER — VARICELLA VIRUS VACCINE LIVE 1350 PFU/0.5ML IJ SUSR
0.5000 mL | INTRAMUSCULAR | Status: AC | PRN
  Administered 2023-12-23: 0.5 mL via SUBCUTANEOUS
  Filled 2023-12-21 (×2): qty 0.5

## 2023-12-21 MED ORDER — PHENYLEPHRINE HCL (PRESSORS) 10 MG/ML IV SOLN
INTRAVENOUS | Status: AC
  Filled 2023-12-21: qty 1

## 2023-12-21 MED ORDER — CEFAZOLIN SODIUM-DEXTROSE 2-4 GM/100ML-% IV SOLN
INTRAVENOUS | Status: AC
  Filled 2023-12-21: qty 100

## 2023-12-21 MED ORDER — BUPIVACAINE HCL (PF) 0.25 % IJ SOLN
INTRAMUSCULAR | Status: DC | PRN
  Administered 2023-12-21: 30 mL

## 2023-12-21 MED ORDER — ACETAMINOPHEN 500 MG PO TABS
ORAL_TABLET | ORAL | Status: AC
  Filled 2023-12-21: qty 2

## 2023-12-21 MED ORDER — MORPHINE SULFATE (PF) 0.5 MG/ML IJ SOLN
INTRAMUSCULAR | Status: DC | PRN
  Administered 2023-12-21: 3 mg via EPIDURAL

## 2023-12-21 MED ORDER — LIDOCAINE HCL (PF) 2 % IJ SOLN
INTRAMUSCULAR | Status: AC
  Filled 2023-12-21: qty 20

## 2023-12-21 MED ORDER — WITCH HAZEL-GLYCERIN EX PADS: 1.0000 | MEDICATED_PAD | CUTANEOUS | Status: DC | PRN

## 2023-12-21 MED ORDER — DEXAMETHASONE SODIUM PHOSPHATE 10 MG/ML IJ SOLN
INTRAMUSCULAR | Status: DC | PRN
  Administered 2023-12-21: 10 mg via INTRAVENOUS

## 2023-12-21 MED ORDER — SODIUM CHLORIDE 0.9 % IV SOLN
500.0000 mg | INTRAVENOUS | Status: AC
  Administered 2023-12-21: 500 mg via INTRAVENOUS

## 2023-12-21 MED ORDER — FENTANYL CITRATE (PF) 100 MCG/2ML IJ SOLN
INTRAMUSCULAR | Status: AC
  Filled 2023-12-21: qty 2

## 2023-12-21 MED ORDER — MENTHOL 3 MG MT LOZG: 1.0000 | LOZENGE | OROMUCOSAL | Status: DC | PRN

## 2023-12-21 MED ORDER — CEFAZOLIN SODIUM-DEXTROSE 2-4 GM/100ML-% IV SOLN
2.0000 g | INTRAVENOUS | Status: AC
  Administered 2023-12-21: 2 g via INTRAVENOUS

## 2023-12-21 MED ORDER — COCONUT OIL OIL: 1.0000 | TOPICAL_OIL | Status: DC | PRN

## 2023-12-21 MED ORDER — BISACODYL 10 MG RE SUPP: 10.0000 mg | Freq: Every day | RECTAL | Status: DC | PRN

## 2023-12-21 MED ORDER — GABAPENTIN 300 MG PO CAPS
300.0000 mg | ORAL_CAPSULE | Freq: Every day | ORAL | Status: DC
  Administered 2023-12-21 – 2023-12-22 (×2): 300 mg via ORAL
  Filled 2023-12-21 (×2): qty 1

## 2023-12-21 MED ORDER — ACETAMINOPHEN 500 MG PO TABS
1000.0000 mg | ORAL_TABLET | Freq: Four times a day (QID) | ORAL | Status: DC
  Administered 2023-12-21 – 2023-12-23 (×8): 1000 mg via ORAL
  Filled 2023-12-21 (×9): qty 2

## 2023-12-21 MED ORDER — DIPHENHYDRAMINE HCL 25 MG PO CAPS: 25.0000 mg | ORAL_CAPSULE | Freq: Four times a day (QID) | ORAL | Status: DC | PRN

## 2023-12-21 MED ORDER — FLEET ENEMA RE ENEM: 1.0000 | ENEMA | Freq: Every day | RECTAL | Status: DC | PRN

## 2023-12-21 MED ORDER — OXYCODONE HCL 5 MG PO TABS
5.0000 mg | ORAL_TABLET | ORAL | Status: DC | PRN
  Administered 2023-12-21: 5 mg via ORAL
  Administered 2023-12-22 – 2023-12-23 (×7): 10 mg via ORAL
  Filled 2023-12-21 (×3): qty 2
  Filled 2023-12-21: qty 1
  Filled 2023-12-21 (×2): qty 2
  Filled 2023-12-21: qty 1
  Filled 2023-12-21: qty 2
  Filled 2023-12-21: qty 1

## 2023-12-21 MED ORDER — SIMETHICONE 80 MG PO CHEW: 80.0000 mg | CHEWABLE_TABLET | ORAL | Status: DC | PRN

## 2023-12-21 MED ORDER — KETOROLAC TROMETHAMINE 30 MG/ML IJ SOLN
30.0000 mg | Freq: Four times a day (QID) | INTRAMUSCULAR | Status: AC
  Administered 2023-12-21 – 2023-12-22 (×4): 30 mg via INTRAVENOUS
  Filled 2023-12-21 (×4): qty 1

## 2023-12-21 MED ORDER — AMMONIA AROMATIC IN INHA
RESPIRATORY_TRACT | Status: AC
  Filled 2023-12-21: qty 10

## 2023-12-21 MED ORDER — IBUPROFEN 600 MG PO TABS
600.0000 mg | ORAL_TABLET | Freq: Four times a day (QID) | ORAL | Status: DC
  Administered 2023-12-22 – 2023-12-23 (×5): 600 mg via ORAL
  Filled 2023-12-21 (×6): qty 1

## 2023-12-21 MED ORDER — TRANEXAMIC ACID-NACL 1000-0.7 MG/100ML-% IV SOLN
INTRAVENOUS | Status: DC | PRN
  Administered 2023-12-21: 1000 mg via INTRAVENOUS

## 2023-12-21 MED ORDER — LACTATED RINGERS AMNIOINFUSION
INTRAVENOUS | Status: DC
  Filled 2023-12-21: qty 1000

## 2023-12-21 MED ORDER — PROPOFOL 10 MG/ML IV BOLUS
INTRAVENOUS | Status: AC
  Filled 2023-12-21: qty 20

## 2023-12-21 MED ORDER — AZITHROMYCIN 500 MG IV SOLR
INTRAVENOUS | Status: AC
  Filled 2023-12-21: qty 5

## 2023-12-21 MED ORDER — KETOROLAC TROMETHAMINE 30 MG/ML IJ SOLN
30.0000 mg | Freq: Once | INTRAMUSCULAR | Status: AC
  Administered 2023-12-21: 30 mg via INTRAVENOUS
  Filled 2023-12-21: qty 1

## 2023-12-21 MED ORDER — ENOXAPARIN SODIUM 40 MG/0.4ML IJ SOSY
40.0000 mg | PREFILLED_SYRINGE | INTRAMUSCULAR | Status: DC
  Administered 2023-12-22 – 2023-12-23 (×2): 40 mg via SUBCUTANEOUS
  Filled 2023-12-21 (×2): qty 0.4

## 2023-12-21 MED ORDER — PHENYLEPHRINE HCL-NACL 20-0.9 MG/250ML-% IV SOLN
INTRAVENOUS | Status: DC | PRN
  Administered 2023-12-21: 30 ug/min via INTRAVENOUS

## 2023-12-21 MED ORDER — OXYTOCIN-SODIUM CHLORIDE 30-0.9 UT/500ML-% IV SOLN: 2.5000 [IU]/h | INTRAVENOUS | Status: AC

## 2023-12-21 MED ORDER — MORPHINE SULFATE (PF) 0.5 MG/ML IJ SOLN
INTRAMUSCULAR | Status: AC
  Filled 2023-12-21: qty 10

## 2023-12-21 MED ORDER — LACTATED RINGERS IV SOLN
INTRAVENOUS | Status: DC | PRN
Start: 2023-12-21 — End: 2023-12-21

## 2023-12-21 MED ORDER — LIDOCAINE HCL (PF) 2 % IJ SOLN
INTRAMUSCULAR | Status: DC | PRN
  Administered 2023-12-21: 3 mL via EPIDURAL
  Administered 2023-12-21: 15 mL via EPIDURAL

## 2023-12-21 MED ORDER — SOD CITRATE-CITRIC ACID 500-334 MG/5ML PO SOLN
ORAL | Status: AC
  Filled 2023-12-21: qty 15

## 2023-12-21 MED ORDER — 0.9 % SODIUM CHLORIDE (POUR BTL) OPTIME
TOPICAL | Status: DC | PRN
  Administered 2023-12-21: 1000 mL

## 2023-12-21 MED ORDER — ACETAMINOPHEN 500 MG PO TABS
1000.0000 mg | ORAL_TABLET | Freq: Once | ORAL | Status: AC
  Administered 2023-12-21: 1000 mg via ORAL

## 2023-12-21 MED ORDER — PRENATAL MULTIVITAMIN CH
1.0000 | ORAL_TABLET | Freq: Every day | ORAL | Status: DC
  Administered 2023-12-21 – 2023-12-23 (×3): 1 via ORAL
  Filled 2023-12-21 (×3): qty 1

## 2023-12-21 MED ORDER — SOD CITRATE-CITRIC ACID 500-334 MG/5ML PO SOLN
30.0000 mL | ORAL | Status: AC
Start: 2023-12-21 — End: 2023-12-21
  Administered 2023-12-21: 30 mL via ORAL

## 2023-12-21 MED ORDER — LACTATED RINGERS IV BOLUS
1000.0000 mL | Freq: Once | INTRAVENOUS | Status: AC
  Administered 2023-12-21: 1000 mL via INTRAVENOUS

## 2023-12-21 MED ORDER — DIBUCAINE (PERIANAL) 1 % EX OINT: 1.0000 | TOPICAL_OINTMENT | CUTANEOUS | Status: DC | PRN

## 2023-12-21 MED ORDER — SENNOSIDES-DOCUSATE SODIUM 8.6-50 MG PO TABS
2.0000 | ORAL_TABLET | ORAL | Status: DC
  Administered 2023-12-21 – 2023-12-23 (×3): 2 via ORAL
  Filled 2023-12-21 (×3): qty 2

## 2023-12-21 MED ORDER — BUPIVACAINE HCL (PF) 0.5 % IJ SOLN
INTRAMUSCULAR | Status: AC
  Filled 2023-12-21: qty 30

## 2023-12-21 SURGICAL SUPPLY — 26 items
BENZOIN TINCTURE PRP APPL 2/3 (GAUZE/BANDAGES/DRESSINGS) IMPLANT
CHLORAPREP W/TINT 26 (MISCELLANEOUS) ×2 IMPLANT
DRSG OPSITE POSTOP 4X10 (GAUZE/BANDAGES/DRESSINGS) IMPLANT
DRSG TELFA 3X8 NADH STRL (GAUZE/BANDAGES/DRESSINGS) ×2 IMPLANT
ELECTRODE REM PT RTRN 9FT ADLT (ELECTROSURGICAL) ×2 IMPLANT
GAUZE SPONGE 4X4 12PLY STRL (GAUZE/BANDAGES/DRESSINGS) ×2 IMPLANT
GOWN STRL REUS W/ TWL LRG LVL3 (GOWN DISPOSABLE) ×6 IMPLANT
MANIFOLD NEPTUNE II (INSTRUMENTS) ×2 IMPLANT
MAT PREVALON FULL STRYKER (MISCELLANEOUS) ×2 IMPLANT
NDL HYPO 25GX1X1/2 BEV (NEEDLE) ×2 IMPLANT
NEEDLE HYPO 25GX1X1/2 BEV (NEEDLE) ×1 IMPLANT
NS IRRIG 1000ML POUR BTL (IV SOLUTION) ×2 IMPLANT
PACK C SECTION AR (MISCELLANEOUS) ×2 IMPLANT
PAD OB MATERNITY 11 LF (PERSONAL CARE ITEMS) ×2 IMPLANT
PAD PREP OB/GYN DISP 24X41 (PERSONAL CARE ITEMS) ×2 IMPLANT
SCRUB CHG 4% DYNA-HEX 4OZ (MISCELLANEOUS) ×2 IMPLANT
STAPLER INSORB 30 2030 C-SECTI (MISCELLANEOUS) IMPLANT
STRIP CLOSURE SKIN 1/2X4 (GAUZE/BANDAGES/DRESSINGS) IMPLANT
SUT VIC AB 0 CT1 36 (SUTURE) ×4 IMPLANT
SUT VIC AB 0 CTX36XBRD ANBCTRL (SUTURE) ×4 IMPLANT
SUT VIC AB 2-0 SH 27XBRD (SUTURE) ×4 IMPLANT
SUTURE MNCRL 4-0 27XMF (SUTURE) ×2 IMPLANT
SYR 30ML LL (SYRINGE) ×4 IMPLANT
TAPE TRANSPORE STRL 2 31045 (GAUZE/BANDAGES/DRESSINGS) IMPLANT
TRAP FLUID SMOKE EVACUATOR (MISCELLANEOUS) ×2 IMPLANT
WATER STERILE IRR 500ML POUR (IV SOLUTION) ×2 IMPLANT

## 2023-12-21 NOTE — Anesthesia Procedure Notes (Signed)
 Date/Time: 12/21/2023 2:00 AM  Performed by: Racheal Buddle, CRNAPre-anesthesia Checklist: Patient identified, Emergency Drugs available, Suction available and Patient being monitored Patient Re-evaluated:Patient Re-evaluated prior to induction Oxygen Delivery Method: Nasal cannula Induction Type: IV induction Dental Injury: Teeth and Oropharynx as per pre-operative assessment  Comments: Nasal cannula with etCO2 monitoring

## 2023-12-21 NOTE — Op Note (Addendum)
 Cesarean Section Procedure Note  Date of procedure: 12/21/2023   Pre-operative Diagnosis: Intrauterine pregnancy at [redacted]w[redacted]d;  - Persistent category two strip remote from delivery - arrest of dilation  Post-operative Diagnosis: same, delivered.  Procedure: primary Low Transverse Cesarean Section through Pfannenstiel incision  Surgeon: Prescilla Brod, MD  Assistant(s):  [name], CNM   An experienced assistant was required given the standard of surgical care given the complexity of the case.  This assistant was needed for exposure, dissection, suctioning, retraction, instrument exchange,  CNM assisting with delivery with administration of fundal pressure, and for overall help during the procedure.   Anesthesia: Epidural anesthesia  Anesthesiologist: Lattie Poli, MD Anesthesiologist: Lattie Poli, MD CRNA: Racheal Buddle, CRNA; Michelet, Trevor Fudge, CRNA  Estimated Blood Loss:  900, TXA given         Drains: Foley         Total IV Fluids:  Urine Output:         Specimens: cord gases, cord blood for Rh pos         Complications:  None; patient tolerated the procedure well.         Disposition: PACU - hemodynamically stable.         Condition: stable  Findings:  A female infant in cephalic OP presentation. Amniotic fluid - Clear  Birth weight 2770 g.   APGAR (1 MIN):  2 APGAR (5 MINS):  8 APGAR (10 MINS):     Neonatal cord arterial gas: 7.02  Intact placenta with a three-vessel cord.  Grossly normal uterus, tubes and ovaries bilaterally. No intraabdominal adhesions were noted.  Indications: non-reassuring fetal status. Pt for elective induciton at term, GBS pos (adequately treated) and failure to proceed beyond 5cm. Cat II fetal heart tracing intermittently improved but persistent, without consistent resolution with conservative measures. While decision for surgery being made, FHT at 0149 prompted urgent c/s and we moved quickly to  delivery.    Procedure Details  The patient was taken to Operating Room, identified as the correct patient and the procedure verified as C-Section Delivery. A formal Time Out was held with all team members present and in agreement. Vaginal betadine and cervical check confirmed 5cm.  After confirmation of anesthesia, the patient was draped and prepped in the usual sterile manner. 02:08: A Pfannenstiel skin incision was made and carried down through the subcutaneous tissue to the fascia. Fascial incision was made and extended transversely with the Mayo scissors. The fascia was separated from the underlying rectus tissue superiorly and inferiorly. The peritoneum was identified and entered bluntly. Peritoneal incision was extended longitudinally. The utero-vesical peritoneal reflection was incised transversely.   A low transverse hysterotomy was made. The fetus was delivered atraumatically. The umbilical cord was clamped x2 and cut and the infant was handed to the awaiting pediatricians. The placenta was removed intact and appeared normal, intact, and with a 3-vessel cord.   The uterus was exteriorized and cleared of all clot and debris. The hysterotomy was closed with running sutures of 0-Vicryl. A second imbricating layer was placed with the same suture. Excellent hemostasis was observed. The peritoneal cavity was cleared of all clots and debris. The uterus was returned to the abdomen.   Gutters and pelvis were evaluated and excellent hemostasis was noted. The fascia was then reapproximated with running sutures of 0 Maxon. The skin was reapproximated with Ensorb absorbable staples. 30 of 0.5% bupivicaine was placed in the fascial and skin lines.  Instrument, sponge, and needle counts were  correct prior to the abdominal closure and at the conclusion of the case.   The patient tolerated the procedure well and was transferred to the recovery room in stable condition.   Prescilla Brod, MD 12/21/2023

## 2023-12-21 NOTE — Anesthesia Postprocedure Evaluation (Signed)
 Anesthesia Post Note  Patient: Sally Harrison  Procedure(s) Performed: CESAREAN DELIVERY  Anesthesia Type: Epidural Anesthetic complications: no   No notable events documented.   Last Vitals:  Vitals:   12/21/23 0430 12/21/23 0540  BP: 124/72 111/69  Pulse:  76  Resp:    Temp:    SpO2:  94%    Last Pain:  Vitals:   12/21/23 0543  TempSrc:   PainSc: (P) 4                  Forestine Igo

## 2023-12-21 NOTE — Transfer of Care (Signed)
 Immediate Anesthesia Transfer of Care Note  Patient: Sally Harrison  Procedure(s) Performed: Procedure(s): CESAREAN DELIVERY (N/A)  Patient Location: L & D   Anesthesia Type:Epidural  Level of Consciousness: Awake and Alert  Airway & Oxygen Therapy: Patient Spontanous Breathing and Patient on room air  Post-op Assessment: Report given to RN and Post -op Vital signs reviewed and stable  Post vital signs: Reviewed and stable  Last Vitals:  Vitals:   12/21/23 0104 12/21/23 0254  BP:  124/75  Pulse:    Resp:  (!) 22  Temp:    SpO2: 97%     Complications: No apparent anesthesia complications

## 2023-12-21 NOTE — Progress Notes (Addendum)
 G1P0000 at [redacted]w[redacted]d, non reassuring fetal heart tones, persistent Cat II strip not responsive to conservative measures.  The risks of cesarean section discussed with the patient included but were not limited to: bleeding which may require transfusion or reoperation; infection which may require antibiotics; injury to bowel, bladder, ureters or other surrounding organs; injury to the fetus; need for additional procedures including hysterectomy in the event of a life-threatening hemorrhage; placental abnormalities wth subsequent pregnancies, incisional problems, thromboembolic phenomenon and other postoperative/anesthesia complications. The patient concurred with the proposed plan, giving informed written consent for the procedure. Anesthesia and OR aware. Preoperative prophylactic antibiotics and SCDs ordered on call to the OR.  To OR when ready.  Addendum: Confirmed that pitocin has been stopped for "the last couple of hours" and she is s/p fluid bolus and position change.
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
## 2023-12-21 NOTE — Anesthesia Postprocedure Evaluation (Signed)
 Anesthesia Post Note  Patient: Sally Harrison  Procedure(s) Performed: CESAREAN DELIVERY  Patient location during evaluation: L&D Anesthesia Type: Epidural Level of consciousness: awake Pain management: pain level controlled Vital Signs Assessment: post-procedure vital signs reviewed and stable Respiratory status: spontaneous breathing and respiratory function stable Cardiovascular status: stable Postop Assessment: no headache, no apparent nausea or vomiting, able to ambulate and no backache Anesthetic complications: no  No notable events documented.   Last Vitals:  Vitals:   12/21/23 0430 12/21/23 0540  BP: 124/72 111/69  Pulse:  76  Resp:    Temp:    SpO2:  94%    Last Pain:  Vitals:   12/21/23 0543  TempSrc:   PainSc: (P) 4                  Forestine Igo
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
## 2023-12-21 NOTE — Lactation Note (Signed)
 This note was copied from a baby's chart. Lactation Consultation Note  Patient Name: Sally Harrison ZOXWR'[id number] Date: 12/21/2023 Age:20 hours     Lactation Tools Discussed/Used LC provided patient w/ a Medela Manual pump and size 21mm flange.  No education was provided at this time.   Interventions LC provided patient w/ a Medicaid lactation list so patient could call to get a breastpump.       Sally Harrison 12/21/2023, 2:47 PM

## 2023-12-22 ENCOUNTER — Encounter: Payer: Self-pay | Admitting: Obstetrics and Gynecology

## 2023-12-22 LAB — CBC
HCT: 26.9 % — ABNORMAL LOW (ref 36.0–46.0)
Hemoglobin: 9.1 g/dL — ABNORMAL LOW (ref 12.0–15.0)
MCH: 30.3 pg (ref 26.0–34.0)
MCHC: 33.8 g/dL (ref 30.0–36.0)
MCV: 89.7 fL (ref 80.0–100.0)
Platelets: 203 10*3/uL (ref 150–400)
RBC: 3 MIL/uL — ABNORMAL LOW (ref 3.87–5.11)
RDW: 14 % (ref 11.5–15.5)
WBC: 20.8 10*3/uL — ABNORMAL HIGH (ref 4.0–10.5)
nRBC: 0 % (ref 0.0–0.2)

## 2023-12-22 NOTE — Lactation Note (Signed)
 This note was copied from a baby's chart. Lactation Consultation Note  Patient Name: Sally Harrison ZOXWR'U Date: 12/22/2023 Age:20 hours Reason for consult: Follow-up assessment;Primapara;Term   Maternal Data Follow up assessment w/ patient and infant.  Patient stated that she started pumping over night but started using a DEBP.  She said she has been getting milk and feeding that milk to him and then supplementing with formula.  She is using a size 24mm flange at the time and stated it fits well. Mom also stated that her breast do feel heavier today.  Feeding Mother's Current Feeding Choice: Breast Milk and Formula Nipple Type: Slow - flow  Interventions Interventions: Breast feeding basics reviewed;DEBP;Education;CDC milk storage guidelines  LC reviewed the importance of offering breast first and then supplementing if need be.  Education was provided on clusterfeeding.    Discharge Discharge Education: Engorgement and breast care;Outpatient recommendation  Education on engorgement prevention/treatment was discussed as well as breastmilk storage guidelines.  LC provided patient with a handout on breastmilk storage guidelines from [facility]. [facility] outpatient lactation services phone number written on the white board in the room.  Patient verbalized understanding.   Consult Status Consult Status: Follow-up Follow-up type: In-patient    Sally Harrison 12/22/2023, 11:03 AM

## 2023-12-22 NOTE — Plan of Care (Signed)
 Patient up to the shower this shift, pain well controlled with PO meds, tolerating regular diet, breast feeding well, honeycomb in place, dried drainage marked and has not changed this shift.  Erlene Hawks, RN 12/22/23 @1722 

## 2023-12-22 NOTE — Progress Notes (Signed)
 Postpartum/PostOp Day #1  Subjective: 18 y.o. G1P1001 postpartum/PostOp Day #1 status post primary cesarean section. She is ambulating, is tolerating po, is voiding spontaneously.  Her pain is well controlled on PO pain medications. Her lochia is less than menses.  Objective: BP 124/63 (BP Location: Left Arm)   Pulse 73   Temp 97.8 F (36.6 C) (Oral)   Resp 18   Ht 5\' 3"  (1.6 m)   Wt 77 kg   LMP 03/22/2023   SpO2 100%   Breastfeeding Unknown   BMI 30.07 kg/m    Physical Exam:  General: alert, appears stated age, fatigued, and no distress Breasts: soft/nontender Pulm: nl effort Abdomen: soft, non-tender, active bowel sounds Uterine Fundus: firm Incision: healing well, no significant drainage, no dehiscence, no significant erythema Perineum: minimal edema, intact Lochia: appropriate DVT Evaluation: No evidence of DVT seen on physical exam.  Recent Labs    12/21/23 0939 12/22/23 0603  HGB 11.3* 9.1*  HCT 32.1* 26.9*  WBC 25.7* 20.8*  PLT 242 203    Assessment/Plan: 17 y.o. G1P1001 postpartum/PostOp day # 1  1. Continue routine postpartum care  2. Infant feeding status: breast feeding -Lactation consult PRN for breastfeeding   3. Contraception plan: Undecided   4. Acute blood loss anemia - clinically significant.   - Hgb: 9.1 (12/22/2023) -Hemodynamically stable and asymptomatic -Intervention: continue on oral supplementation with ferrous sulfate  325  5. Immunization status:   needs Varicella prior to discharge   6. Encourage ambulation and frequent change of position to promote recovery   Disposition: continue inpatient postpartum care    LOS: 2 days   Noelle Hoogland, CNM 12/22/2023, 11:17 AM   ----- Maeve Debord Certified Nurse Midwife [facility]

## 2023-12-23 DIAGNOSIS — Z98891 History of uterine scar from previous surgery: Principal | ICD-10-CM

## 2023-12-23 MED ORDER — SENNOSIDES-DOCUSATE SODIUM 8.6-50 MG PO TABS: 2.0000 | ORAL_TABLET | Freq: Every evening | ORAL | 0 refills | Status: AC | PRN

## 2023-12-23 MED ORDER — OXYCODONE HCL 5 MG PO TABS: 5.0000 mg | ORAL_TABLET | Freq: Four times a day (QID) | ORAL | 0 refills | Status: AC | PRN

## 2023-12-23 MED ORDER — IBUPROFEN 600 MG PO TABS: 600.0000 mg | ORAL_TABLET | Freq: Four times a day (QID) | ORAL | 0 refills | Status: AC | PRN

## 2023-12-23 MED ORDER — ACETAMINOPHEN 500 MG PO TABS: 1000.0000 mg | ORAL_TABLET | Freq: Four times a day (QID) | ORAL | 0 refills | Status: AC | PRN

## 2023-12-23 MED ORDER — IRON SUCROSE 300 MG IVPB - SIMPLE MED
300.0000 mg | Freq: Once | Status: DC
  Filled 2023-12-23: qty 265

## 2023-12-23 MED ORDER — SIMETHICONE 80 MG PO CHEW: 80.0000 mg | CHEWABLE_TABLET | Freq: Four times a day (QID) | ORAL | 0 refills | Status: AC | PRN

## 2023-12-23 MED ORDER — FERROUS SULFATE 325 (65 FE) MG PO TABS: 325.0000 mg | ORAL_TABLET | Freq: Two times a day (BID) | ORAL | 3 refills | Status: AC

## 2023-12-23 NOTE — TOC Transition Note (Signed)
 Transition of Care [facility]) - Discharge Note   Patient Details  Name: Sally Harrison MRN: [id number] Date of Birth: 2005-11-24  Transition of Care [facility]) CM/SW Contact:  Crayton Docker, RN 12/23/2023, 12:15 PM   Clinical Narrative:     Discharge orders noted for home/self care. CM call to patient's phone: [phone] regarding pending discharge, transportation and follow up appointments. Patient states has chosen [facility] for follow up appointment for newborn and agrees to make newborn appointment on Monday, 12/25/2023. Per patient is aware of 2 week post op follow up appointment at [facility] and 6 week follow up appointment at [facility]. Per patient, patient's significant other, Sally Harrison is at patient's bedside and will provide transportation home. Per patient, requests for contact list to be updated with significant other's number. Contact list updated to reflect significant other's name and phone number.   Final next level of care: Home/Self Care Barriers to Discharge: No Barriers Identified   Patient Goals and CMS Choice    Home/self care  Discharge Placement      Home/self care          Discharge Plan and Services Additional resources added to the After Visit Summary for     Social Drivers of Health (SDOH) Interventions SDOH Screenings   Food Insecurity: No Food Insecurity (12/20/2023)  Housing: Low Risk  (12/21/2023)  Transportation Needs: No Transportation Needs (12/20/2023)  Utilities: Not At Risk (12/20/2023)  Depression (PHQ2-9): Low Risk  (05/04/2023)  Social Connections: Unknown (12/20/2023)  Tobacco Use: Medium Risk (12/20/2023)     Readmission Risk Interventions     No data to display

## 2023-12-23 NOTE — Progress Notes (Signed)
 AVS instructions reviewed w/ pt, all questions answered.
